# Patient Record
Sex: Female | Born: 1975 | Race: White | Hispanic: No | Marital: Married | Smoking: Never smoker
Health system: Southern US, Community
[De-identification: ages and names within clinical notes are randomized; demographics above are authoritative.]

## PROBLEM LIST (undated history)

## (undated) DIAGNOSIS — I10 Essential (primary) hypertension: Secondary | ICD-10-CM

## (undated) DIAGNOSIS — E119 Type 2 diabetes mellitus without complications: Secondary | ICD-10-CM

---

## 2016-07-16 ENCOUNTER — Other Ambulatory Visit (HOSPITAL_COMMUNITY)
Admission: RE | Admit: 2016-07-16 | Discharge: 2016-07-16 | Disposition: A | Discharge status: Home / Self Care | Payer: Managed Care, Other (non HMO) | Source: Ambulatory Visit | Attending: Obstetrics and Gynecology | Admitting: Obstetrics and Gynecology

## 2016-07-16 ENCOUNTER — Other Ambulatory Visit: Payer: Self-pay | Admitting: Obstetrics and Gynecology

## 2016-07-16 DIAGNOSIS — Z01411 Encounter for gynecological examination (general) (routine) with abnormal findings: Secondary | ICD-10-CM | POA: Diagnosis present

## 2016-07-16 DIAGNOSIS — Z1151 Encounter for screening for human papillomavirus (HPV): Secondary | ICD-10-CM | POA: Diagnosis present

## 2016-07-18 LAB — CYTOLOGY - PAP

## 2016-11-23 ENCOUNTER — Other Ambulatory Visit: Payer: Self-pay | Admitting: Obstetrics & Gynecology

## 2019-12-12 ENCOUNTER — Emergency Department (HOSPITAL_COMMUNITY): Payer: 59

## 2019-12-12 ENCOUNTER — Other Ambulatory Visit: Payer: Self-pay

## 2019-12-12 ENCOUNTER — Emergency Department (HOSPITAL_COMMUNITY)
Admission: EM | Admit: 2019-12-12 | Discharge: 2019-12-12 | Disposition: A | Discharge status: Home / Self Care | Payer: 59 | Attending: Emergency Medicine | Admitting: Emergency Medicine

## 2019-12-12 ENCOUNTER — Encounter (HOSPITAL_COMMUNITY): Payer: Self-pay | Admitting: Emergency Medicine

## 2019-12-12 DIAGNOSIS — R102 Pelvic and perineal pain: Secondary | ICD-10-CM | POA: Diagnosis present

## 2019-12-12 DIAGNOSIS — N390 Urinary tract infection, site not specified: Secondary | ICD-10-CM | POA: Diagnosis not present

## 2019-12-12 DIAGNOSIS — I10 Essential (primary) hypertension: Secondary | ICD-10-CM | POA: Diagnosis not present

## 2019-12-12 DIAGNOSIS — N83202 Unspecified ovarian cyst, left side: Secondary | ICD-10-CM

## 2019-12-12 DIAGNOSIS — E119 Type 2 diabetes mellitus without complications: Secondary | ICD-10-CM | POA: Insufficient documentation

## 2019-12-12 DIAGNOSIS — B373 Candidiasis of vulva and vagina: Secondary | ICD-10-CM | POA: Diagnosis not present

## 2019-12-12 DIAGNOSIS — R52 Pain, unspecified: Secondary | ICD-10-CM

## 2019-12-12 DIAGNOSIS — B3731 Acute candidiasis of vulva and vagina: Secondary | ICD-10-CM

## 2019-12-12 DIAGNOSIS — N809 Endometriosis, unspecified: Secondary | ICD-10-CM | POA: Diagnosis not present

## 2019-12-12 HISTORY — DX: Essential (primary) hypertension: I10

## 2019-12-12 HISTORY — DX: Type 2 diabetes mellitus without complications: E11.9

## 2019-12-12 LAB — URINALYSIS, ROUTINE W REFLEX MICROSCOPIC
Bilirubin Urine: NEGATIVE
Glucose, UA: NEGATIVE mg/dL
Hgb urine dipstick: NEGATIVE
Ketones, ur: NEGATIVE mg/dL
Nitrite: NEGATIVE
Protein, ur: NEGATIVE mg/dL
Specific Gravity, Urine: 1.02 (ref 1.005–1.030)
pH: 6 (ref 5.0–8.0)

## 2019-12-12 LAB — CBC
HCT: 40.6 % (ref 36.0–46.0)
Hemoglobin: 12.4 g/dL (ref 12.0–15.0)
MCH: 26.3 pg (ref 26.0–34.0)
MCHC: 30.5 g/dL (ref 30.0–36.0)
MCV: 86.2 fL (ref 80.0–100.0)
Platelets: 423 10*3/uL — ABNORMAL HIGH (ref 150–400)
RBC: 4.71 MIL/uL (ref 3.87–5.11)
RDW: 13.6 % (ref 11.5–15.5)
WBC: 8.4 10*3/uL (ref 4.0–10.5)
nRBC: 0 % (ref 0.0–0.2)

## 2019-12-12 LAB — WET PREP, GENITAL
Clue Cells Wet Prep HPF POC: NONE SEEN
Sperm: NONE SEEN
Trich, Wet Prep: NONE SEEN

## 2019-12-12 LAB — CBG MONITORING, ED: Glucose-Capillary: 140 mg/dL — ABNORMAL HIGH (ref 70–99)

## 2019-12-12 LAB — COMPREHENSIVE METABOLIC PANEL WITH GFR
ALT: 23 U/L (ref 0–44)
AST: 20 U/L (ref 15–41)
Albumin: 3.9 g/dL (ref 3.5–5.0)
Alkaline Phosphatase: 44 U/L (ref 38–126)
Anion gap: 11 (ref 5–15)
BUN: 11 mg/dL (ref 6–20)
CO2: 23 mmol/L (ref 22–32)
Calcium: 9.5 mg/dL (ref 8.9–10.3)
Chloride: 104 mmol/L (ref 98–111)
Creatinine, Ser: 0.7 mg/dL (ref 0.44–1.00)
GFR calc Af Amer: >60 mL/min
GFR calc non Af Amer: >60 mL/min
Glucose, Bld: 145 mg/dL — ABNORMAL HIGH (ref 70–99)
Potassium: 4.4 mmol/L (ref 3.5–5.1)
Sodium: 138 mmol/L (ref 135–145)
Total Bilirubin: 0.9 mg/dL (ref 0.3–1.2)
Total Protein: 7.2 g/dL (ref 6.5–8.1)

## 2019-12-12 LAB — I-STAT BETA HCG BLOOD, ED (MC, WL, AP ONLY): I-stat hCG, quantitative: <5 m[IU]/mL (ref <5)

## 2019-12-12 LAB — LIPASE, BLOOD: Lipase: 30 U/L (ref 11–51)

## 2019-12-12 MED ORDER — CEPHALEXIN 250 MG PO CAPS
500.0000 mg | ORAL_CAPSULE | Freq: Once | ORAL | Status: AC
  Administered 2019-12-12: 500 mg via ORAL
  Filled 2019-12-12: qty 2

## 2019-12-12 MED ORDER — KETOROLAC TROMETHAMINE 30 MG/ML IJ SOLN
30.0000 mg | Freq: Once | INTRAMUSCULAR | Status: AC
  Administered 2019-12-12: 30 mg via INTRAVENOUS
  Filled 2019-12-12: qty 1

## 2019-12-12 MED ORDER — SODIUM CHLORIDE 0.9% FLUSH: 3.0000 mL | Freq: Once | INTRAVENOUS | Status: DC

## 2019-12-12 MED ORDER — ONDANSETRON HCL 4 MG/2ML IJ SOLN
4.0000 mg | Freq: Once | INTRAMUSCULAR | Status: AC
  Administered 2019-12-12: 4 mg via INTRAVENOUS
  Filled 2019-12-12: qty 2

## 2019-12-12 MED ORDER — HYDROMORPHONE HCL 1 MG/ML IJ SOLN
0.5000 mg | Freq: Once | INTRAMUSCULAR | Status: AC
  Administered 2019-12-12: 0.5 mg via INTRAVENOUS
  Filled 2019-12-12: qty 1

## 2019-12-12 MED ORDER — CEPHALEXIN 500 MG PO CAPS: 1000.0000 mg | ORAL_CAPSULE | Freq: Two times a day (BID) | ORAL | 0 refills | Status: AC

## 2019-12-12 MED ORDER — HYDROMORPHONE HCL 1 MG/ML IJ SOLN
1.0000 mg | Freq: Once | INTRAMUSCULAR | Status: AC
  Administered 2019-12-12: 1 mg via INTRAMUSCULAR
  Filled 2019-12-12: qty 1

## 2019-12-12 MED ORDER — FLUCONAZOLE 200 MG PO TABS
200.0000 mg | ORAL_TABLET | Freq: Once | ORAL | Status: AC
  Administered 2019-12-12: 15:00:00 200 mg via ORAL
  Filled 2019-12-12: qty 1

## 2019-12-12 NOTE — ED Notes (Signed)
Patient verbalizes understanding of discharge instructions . Opportunity for questions and answers were provided . Armband removed by staff ,Pt discharged from ED. W/C  offered at D/C  and Declined W/C at D/C and was escorted to lobby by RN.  

## 2019-12-12 NOTE — ED Notes (Signed)
Patient transported to [facility] 

## 2019-12-12 NOTE — ED Triage Notes (Signed)
C/o L sided pelvic pain that radiates to center of lower back and down L leg with nausea and vomiting since yesterday.  Pain worse over the last hour.  Reports syncopal episode PTA.

## 2019-12-12 NOTE — Discharge Instructions (Addendum)
It was our pleasure to provide your ER care today - we hope that you feel better.  The ultrasound shows a small left ovarian cyst, 1.2 cm. For pelvic pain, endometriosis, as well as the small ovarian cyst, follow up with ob/gyn doctor in the next couple weeks.   Take ibuprofen or naprosyn as need for pain.   For possible urine infection, take antibiotic as prescribed.  Return to ER if worse, new symptoms, high fevers, new/worsening or severe pain, persistent vomiting, or other concern.   You were given pain medication in the ER - no driving for the next 8 hours.

## 2019-12-12 NOTE — ED Provider Notes (Signed)
[facility] EMERGENCY DEPARTMENT Provider Note   CSN: [id number] Arrival date & time: 12/12/19  1038     History Chief Complaint  Patient presents with  . Pelvic Pain  . Back Pain  . Leg Pain    Charlotte Levine is a 44 y.o. female.  Patient c/o left pelvic pain acute onset 3 days ago. Patient notes hx endometriosis and ovarian cysts - indicates significant pain in past related to those issues, but states this pain is sharper. Pain acute onset, moderate-sev, dull to sharp, constant, persistent, felt worse today. No radiation. lnmp 1.5 months ago. No current vaginal discharge or bleeding. No fever or chills. No dysuria or hematuria. No hx kidney stone. Normal appetite. Having normal bms.   The history is provided by the patient.  Pelvic Pain Pertinent negatives include no chest pain, no abdominal pain, no headaches and no shortness of breath.  Back Pain Associated symptoms: leg pain and pelvic pain   Associated symptoms: no abdominal pain, no chest pain, no dysuria, no fever and no headaches   Leg Pain Associated symptoms: back pain   Associated symptoms: no fever and no neck pain        Past Medical History:  Diagnosis Date  . Diabetes mellitus without complication (HCC)   . Hypertension     There are no problems to display for this patient.   History reviewed. No pertinent surgical history.   OB History   No obstetric history on file.     No family history on file.  Social History   Tobacco Use  . Smoking status: Never Smoker  . Smokeless tobacco: Never Used  Substance Use Topics  . Alcohol use: Yes  . Drug use: Not Currently    Home Medications Prior to Admission medications   Not on File    Allergies    Patient has no allergy information on record.  Review of Systems   Review of Systems  Constitutional: Negative for chills and fever.  HENT: Negative for sore throat.   Eyes: Negative for redness.  Respiratory: Negative for  shortness of breath.   Cardiovascular: Negative for chest pain.  Gastrointestinal: Negative for abdominal pain, diarrhea and vomiting.  Genitourinary: Positive for pelvic pain. Negative for dysuria and flank pain.  Musculoskeletal: Positive for back pain. Negative for neck pain.  Skin: Negative for rash.  Neurological: Negative for headaches.  Hematological: Does not bruise/bleed easily.  Psychiatric/Behavioral: Negative for confusion.    Physical Exam Updated Vital Signs BP (!) 175/113 (BP Location: Right Arm)   Pulse (!) 105   Temp 98.6 F (37 C) (Oral)   Resp 20   LMP 10/26/2019   SpO2 100%   Physical Exam Vitals and nursing note reviewed.  Constitutional:      Appearance: Normal appearance. She is well-developed.  HENT:     Head: Atraumatic.     Nose: Nose normal.     Mouth/Throat:     Mouth: Mucous membranes are moist.  Eyes:     General: No scleral icterus.    Conjunctiva/sclera: Conjunctivae normal.  Neck:     Trachea: No tracheal deviation.  Cardiovascular:     Rate and Rhythm: Normal rate.     Pulses: Normal pulses.  Pulmonary:     Effort: Pulmonary effort is normal. No respiratory distress.  Abdominal:     General: Bowel sounds are normal. There is no distension.     Palpations: Abdomen is soft. There is no mass.  Tenderness: There is abdominal tenderness. There is no guarding or rebound.     Hernia: No hernia is present.     Comments: Left pelvic tenderness.   Genitourinary:    Comments: No cva tenderness. Normal external gu exam. Scant vaginal discharge, whitish. No cmt. Cervix closed. No adx mass or focal tenderness.  Musculoskeletal:        General: No swelling.     Cervical back: Normal range of motion and neck supple. No rigidity. No muscular tenderness.     Comments: T/L/S spine non tender, aligned.   Skin:    General: Skin is warm and dry.     Findings: No rash.  Neurological:     Mental Status: She is alert.     Comments: Alert, speech  normal.   Psychiatric:        Mood and Affect: Mood normal.     ED Results / Procedures / Treatments   Labs (all labs ordered are listed, but only abnormal results are displayed) Results for orders placed or performed during the hospital encounter of 12/12/19  Wet prep, genital   Specimen: Vaginal; Genital  Result Value Ref Range   Yeast Wet Prep HPF POC PRESENT (A) NONE SEEN   Trich, Wet Prep NONE SEEN NONE SEEN   Clue Cells Wet Prep HPF POC NONE SEEN NONE SEEN   WBC, Wet Prep HPF POC MANY (A) NONE SEEN   Sperm NONE SEEN   Lipase, blood  Result Value Ref Range   Lipase 30 11 - 51 U/L  Comprehensive metabolic panel  Result Value Ref Range   Sodium 138 135 - 145 mmol/L   Potassium 4.4 3.5 - 5.1 mmol/L   Chloride 104 98 - 111 mmol/L   CO2 23 22 - 32 mmol/L   Glucose, Bld 145 (H) 70 - 99 mg/dL   BUN 11 6 - 20 mg/dL   Creatinine, Ser 4.33 0.44 - 1.00 mg/dL   Calcium 9.5 8.9 - 29.5 mg/dL   Total Protein 7.2 6.5 - 8.1 g/dL   Albumin 3.9 3.5 - 5.0 g/dL   AST 20 15 - 41 U/L   ALT 23 0 - 44 U/L   Alkaline Phosphatase 44 38 - 126 U/L   Total Bilirubin 0.9 0.3 - 1.2 mg/dL   GFR calc non Af Amer >60 >60 mL/min   GFR calc Af Amer >60 >60 mL/min   Anion gap 11 5 - 15  CBC  Result Value Ref Range   WBC 8.4 4.0 - 10.5 K/uL   RBC 4.71 3.87 - 5.11 MIL/uL   Hemoglobin 12.4 12.0 - 15.0 g/dL   HCT 18.8 41.6 - 60.6 %   MCV 86.2 80.0 - 100.0 fL   MCH 26.3 26.0 - 34.0 pg   MCHC 30.5 30.0 - 36.0 g/dL   RDW 30.1 60.1 - 09.3 %   Platelets 423 (H) 150 - 400 K/uL   nRBC 0.0 0.0 - 0.2 %  Urinalysis, Routine w reflex microscopic  Result Value Ref Range   Color, Urine YELLOW YELLOW   APPearance HAZY (A) CLEAR   Specific Gravity, Urine 1.020 1.005 - 1.030   pH 6.0 5.0 - 8.0   Glucose, UA NEGATIVE NEGATIVE mg/dL   Hgb urine dipstick NEGATIVE NEGATIVE   Bilirubin Urine NEGATIVE NEGATIVE   Ketones, ur NEGATIVE NEGATIVE mg/dL   Protein, ur NEGATIVE NEGATIVE mg/dL   Nitrite NEGATIVE NEGATIVE    Leukocytes,Ua TRACE (A) NEGATIVE   RBC / HPF 6-10 0 -  5 RBC/hpf   WBC, UA 11-20 0 - 5 WBC/hpf   Bacteria, UA RARE (A) NONE SEEN   Squamous Epithelial / LPF 0-5 0 - 5   Mucus PRESENT    Non Squamous Epithelial 0-5 (A) NONE SEEN  I-Stat beta hCG blood, ED  Result Value Ref Range   I-stat hCG, quantitative <5.0 <5 mIU/mL   Comment 3          CBG monitoring, ED  Result Value Ref Range   Glucose-Capillary 140 (H) 70 - 99 mg/dL   US PELVIC COMPLETE W TRANSVAGINAL AND TORSION R/O  Result Date: 12/12/2019 CLINICAL DATA:  Left pelvic pain. History of right oophorectomy. History of endometriosis. EXAM: TRANSABDOMINAL AND TRANSVAGINAL ULTRASOUND OF PELVIS DOPPLER ULTRASOUND OF OVARIES TECHNIQUE: Both transabdominal and transvaginal ultrasound examinations of the pelvis were performed. Transabdominal technique was performed for global imaging of the pelvis including uterus, ovaries, adnexal regions, and pelvic cul-de-sac. It was necessary to proceed with endovaginal exam following the transabdominal exam to visualize the uterus, endometrium, and adnexa. Color and duplex Doppler ultrasound was utilized to evaluate blood flow to the ovaries. COMPARISON:  None. FINDINGS: Uterus Measurements: 8.3 by 5.7 by 5.8 cm = volume: 141 mL. No fibroids or other mass visualized. The uterus is anteflexed. Trace fluid in the cervical canal, probably incidental. Heterogeneity of the myometrial echogenicity could reflect adenomyosis. Endometrium Thickness: 0.4 cm.  No focal abnormality visualized. Right ovary Not visualized-patient reports prior right oophorectomy. Left ovary Measurements: 2.8 by 1.9 by 2.1 cm = volume: 5.6 mL. Simple appearing left ovarian cyst or follicle, 1.2 by 1.0 by 1.1 cm. Pulsed Doppler evaluation of both ovaries demonstrates normal low-resistance arterial and venous waveforms. Other findings Accentuated vascularity along the adnexa. IMPRESSION: 1. The left ovary appears normal aside from containing a  1.2 cm small cyst or follicle. The right ovary is surgically absent. 2. Mildly prominent uterus with heterogeneous myometrial echogenicity which could reflect adenomyosis. 3. Accentuated vascularity along the adnexa. Although nonspecific, accentuated parametrial vasculature can be seen in the setting of pelvic congestion syndrome. Electronically Signed   By: Gaylyn Rong M.D.   On: 12/12/2019 13:18    EKG None  Radiology US PELVIC COMPLETE W TRANSVAGINAL AND TORSION R/O  Result Date: 12/12/2019 CLINICAL DATA:  Left pelvic pain. History of right oophorectomy. History of endometriosis. EXAM: TRANSABDOMINAL AND TRANSVAGINAL ULTRASOUND OF PELVIS DOPPLER ULTRASOUND OF OVARIES TECHNIQUE: Both transabdominal and transvaginal ultrasound examinations of the pelvis were performed. Transabdominal technique was performed for global imaging of the pelvis including uterus, ovaries, adnexal regions, and pelvic cul-de-sac. It was necessary to proceed with endovaginal exam following the transabdominal exam to visualize the uterus, endometrium, and adnexa. Color and duplex Doppler ultrasound was utilized to evaluate blood flow to the ovaries. COMPARISON:  None. FINDINGS: Uterus Measurements: 8.3 by 5.7 by 5.8 cm = volume: 141 mL. No fibroids or other mass visualized. The uterus is anteflexed. Trace fluid in the cervical canal, probably incidental. Heterogeneity of the myometrial echogenicity could reflect adenomyosis. Endometrium Thickness: 0.4 cm.  No focal abnormality visualized. Right ovary Not visualized-patient reports prior right oophorectomy. Left ovary Measurements: 2.8 by 1.9 by 2.1 cm = volume: 5.6 mL. Simple appearing left ovarian cyst or follicle, 1.2 by 1.0 by 1.1 cm. Pulsed Doppler evaluation of both ovaries demonstrates normal low-resistance arterial and venous waveforms. Other findings Accentuated vascularity along the adnexa. IMPRESSION: 1. The left ovary appears normal aside from containing a 1.2 cm  small cyst or follicle. The right  ovary is surgically absent. 2. Mildly prominent uterus with heterogeneous myometrial echogenicity which could reflect adenomyosis. 3. Accentuated vascularity along the adnexa. Although nonspecific, accentuated parametrial vasculature can be seen in the setting of pelvic congestion syndrome. Electronically Signed   By: Van Clines M.D.   On: 12/12/2019 13:18    Procedures Procedures (including critical care time)  Medications Ordered in ED Medications  sodium chloride flush (NS) 0.9 % injection 3 mL (has no administration in time range)  HYDROmorphone (DILAUDID) injection 1 mg (has no administration in time range)    ED Course  I have reviewed the triage vital signs and the nursing notes.  Pertinent labs & imaging results that were available during my care of the patient were reviewed by me and considered in my medical decision making (see chart for details).    MDM Rules/Calculators/A&P                      Patient requesting pain medication due to severe pain. Dilaudid im.   Labs sent.  Reviewed nursing notes and prior charts for additional history.   Labs reviewed/interpreted by me - preg negative. ua w 11-20 wbc, ?possible uti.   U/s reviewed/interpreted by me - small ovarian cyst.   Patient indicates pain improved but persists,a nd requests something for nausea. toradol iv, dilaudid .5 mg iv, zofran iv.   Recheck abd soft nt.   Yeast on wet prep. Fluconazole po.   Pt comfortable. No nv. Tolerating po.  Patient currently appears stable for d/c.    Rec pcp/gyn f/u.  Return precautions provided.       Final Clinical Impression(s) / ED Diagnoses Final diagnoses:  None    Rx / DC Orders ED Discharge Orders    None       Lajean Saver, MD 12/12/19 1439

## 2019-12-15 LAB — GC/CHLAMYDIA PROBE AMP (~~LOC~~) NOT AT ARMC
Chlamydia: NEGATIVE
Neisseria Gonorrhea: NEGATIVE

## 2019-12-22 ENCOUNTER — Other Ambulatory Visit: Payer: Self-pay | Admitting: Physician Assistant

## 2019-12-22 ENCOUNTER — Ambulatory Visit
Admission: RE | Admit: 2019-12-22 | Discharge: 2019-12-22 | Disposition: A | Discharge status: Home / Self Care | Payer: 59 | Source: Ambulatory Visit | Attending: Physician Assistant | Admitting: Physician Assistant

## 2019-12-22 DIAGNOSIS — R109 Unspecified abdominal pain: Secondary | ICD-10-CM

## 2020-09-06 IMAGING — CT CT ABD-PELV W/O CM
2 of 4 series · 16 of 46 positions shown, 18 images · non-contrast
Comparison: 12/12/2019 pelvic sonogram.

CLINICAL DATA: Left flank pain radiating to the groin and left
lower quadrant for several days. Recent UTI status post 2 rounds of
antibiotic therapy. Prior right oophorectomy.

EXAM:
CT ABDOMEN AND PELVIS WITHOUT CONTRAST
TECHNIQUE: Multidetector CT imaging of the abdomen and pelvis was performed
following the standard protocol without IV contrast.

[Series 2: renal stone 5.00 br40 s3 axial · axial · 0.97mm/px · z∈[+1192,+1672]mm · 13 of 108 slices shown, 15 images]
[im 8/108  soft-tissue]
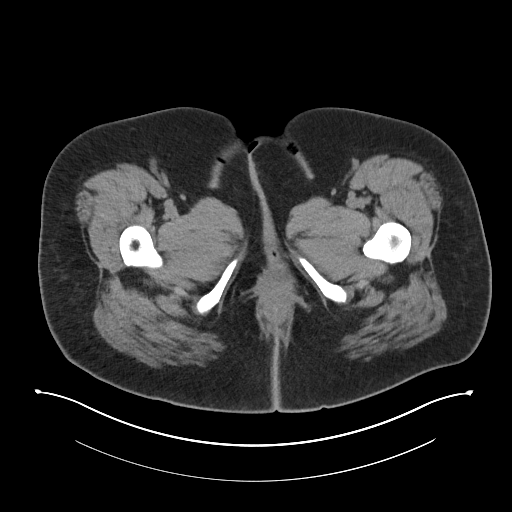
[im 8/108  bone]
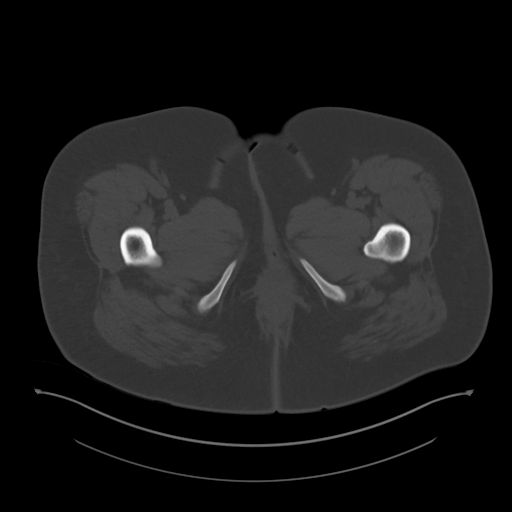
[im 16/108  soft-tissue]
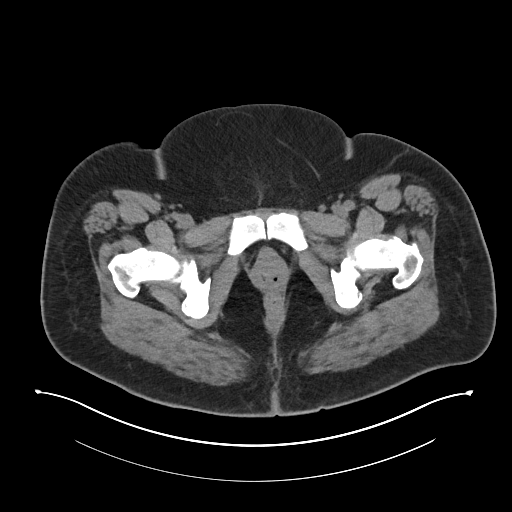
[im 24/108  soft-tissue]
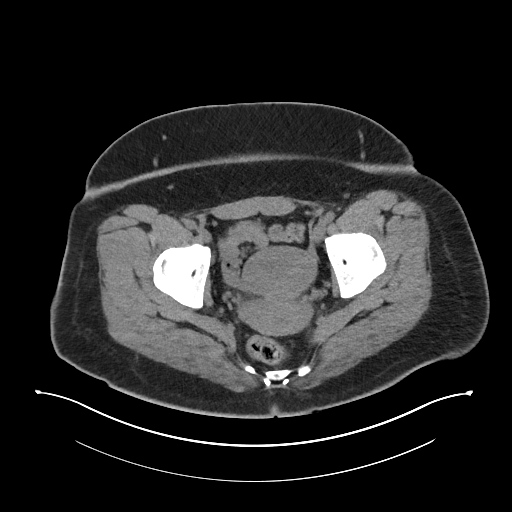
[im 32/108  soft-tissue]
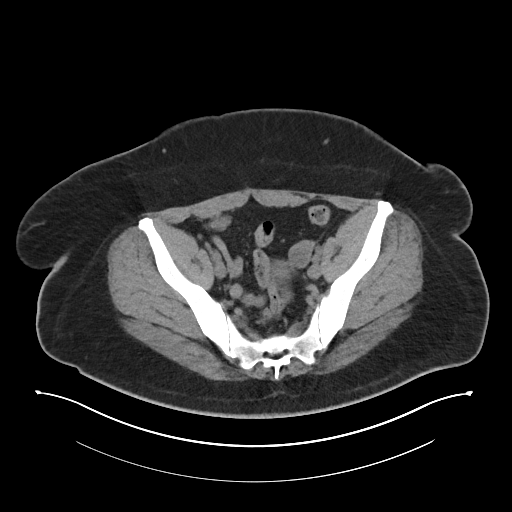
[im 40/108  soft-tissue]
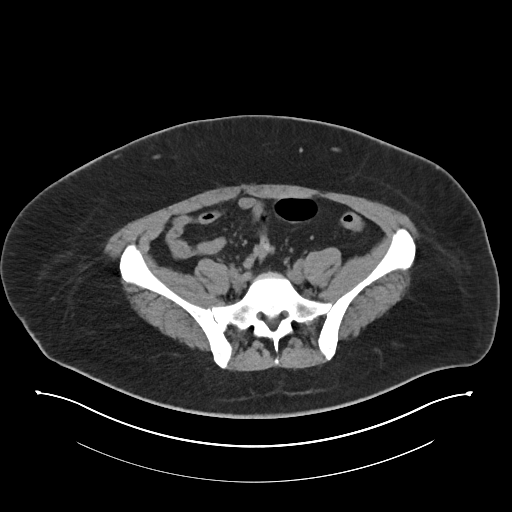
[im 48/108  soft-tissue]
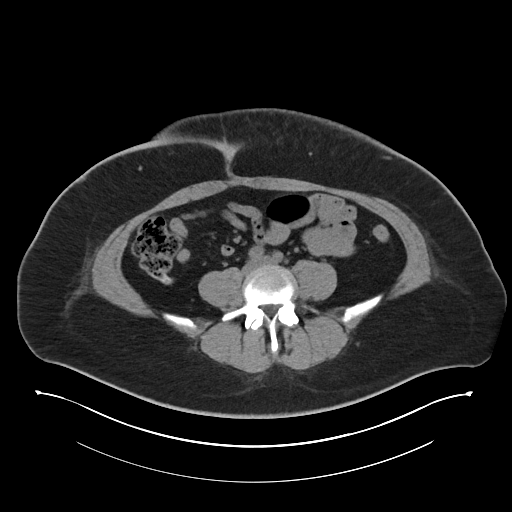
[im 56/108  soft-tissue]
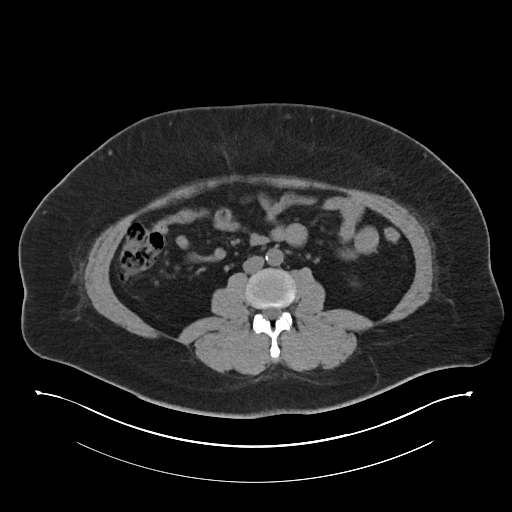
[im 64/108  soft-tissue]
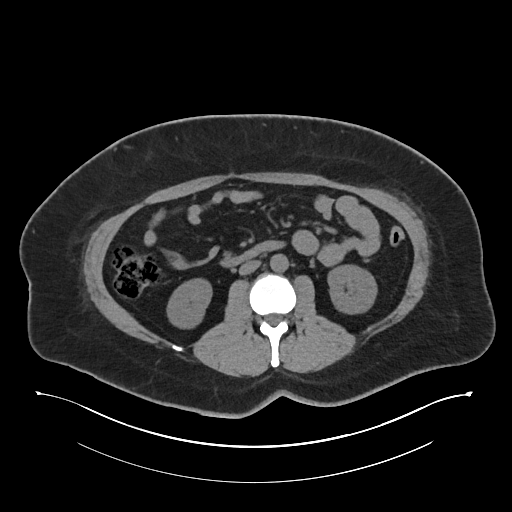
[im 72/108  soft-tissue]
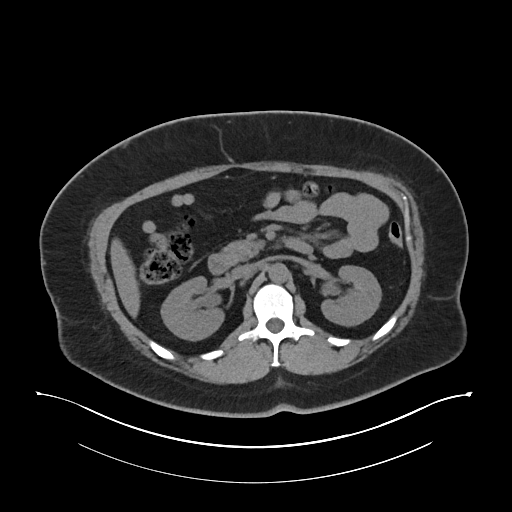
[im 72/108  bone]
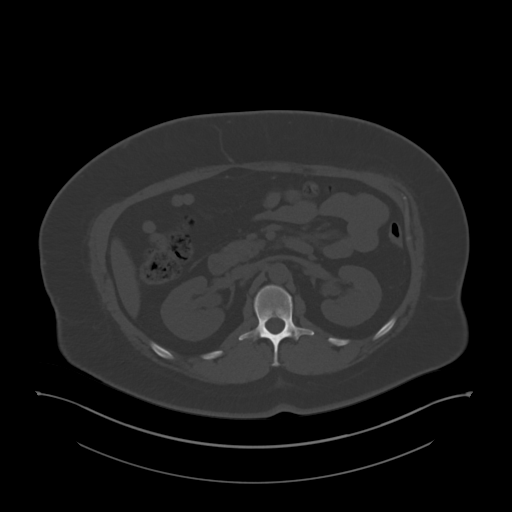
[im 80/108  soft-tissue]
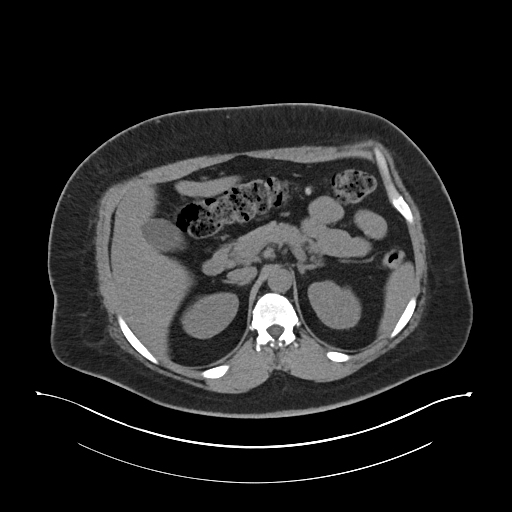
[im 88/108  soft-tissue]
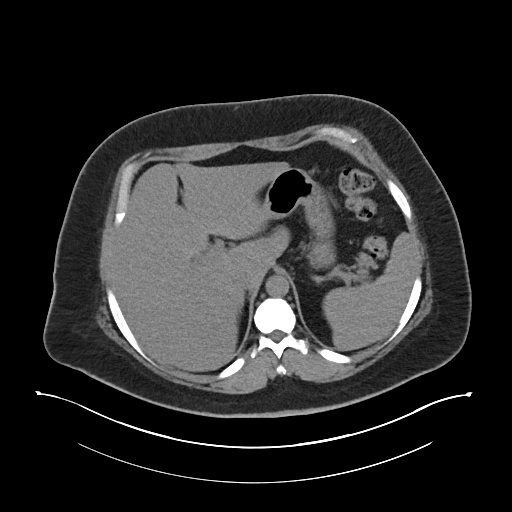
[im 96/108  soft-tissue]
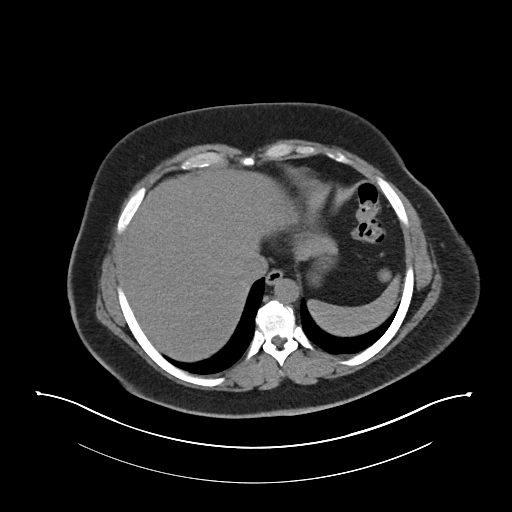
[im 104/108  soft-tissue]
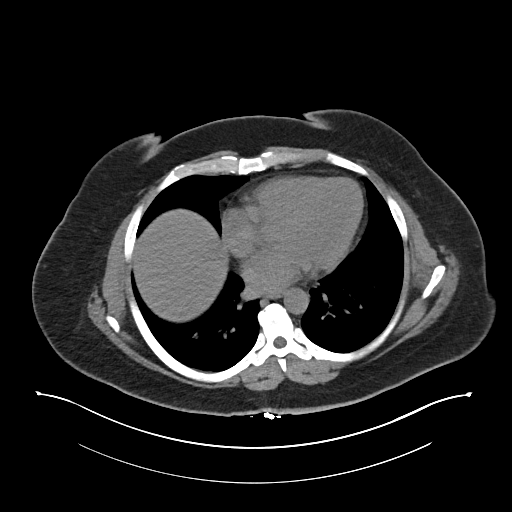

[Series 6: renal stone 2.00 br40 s3 cor · coronal · 0.97mm/px · 3 of 190 slices shown]
[im 64/190  soft-tissue]
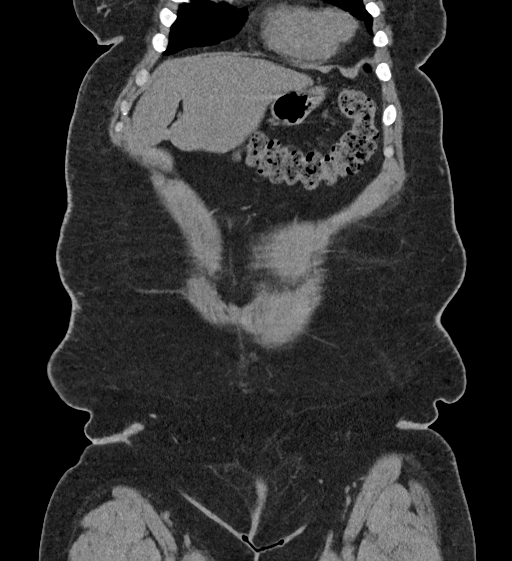
[im 85/190  soft-tissue]
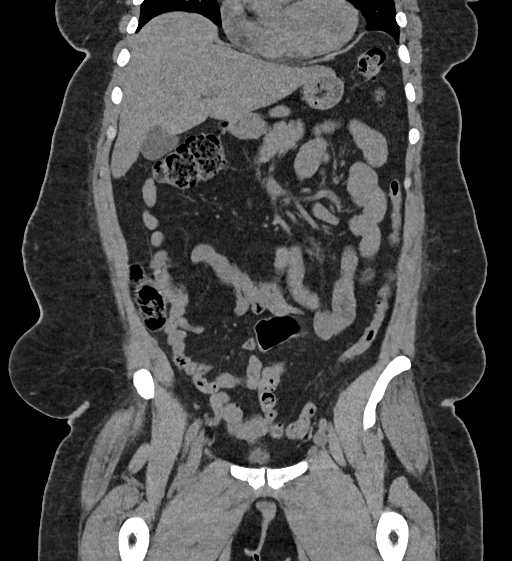
[im 106/190  soft-tissue]
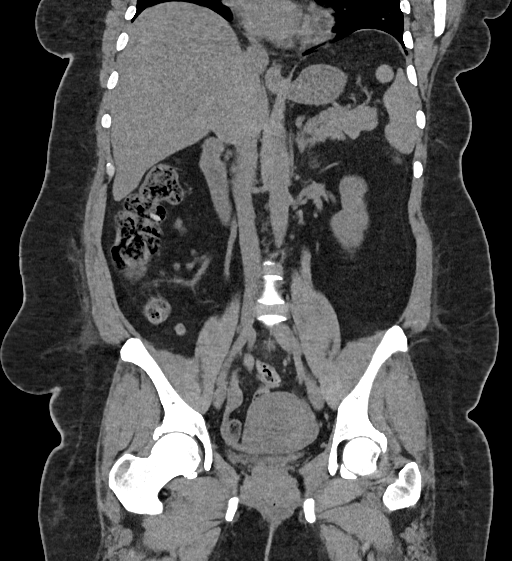

[16 of 46 positions shown; findings below may reference images not displayed]

FINDINGS: Lower chest: No significant pulmonary nodules or acute consolidative
airspace disease.

Hepatobiliary: Normal liver size. No liver mass. Normal gallbladder
with no radiopaque cholelithiasis. No biliary ductal dilatation.

Pancreas: Normal, with no mass or duct dilation.

Spleen: Normal size. No mass.

Adrenals/Urinary Tract: Normal adrenals. Symmetric fullness of the
central renal collecting systems bilaterally with abrupt caliber
transitions at the ureteropelvic junctions bilaterally. No renal
stones. No contour deforming renal masses. Normal caliber ureters.
No ureteral stones. Normal nondistended bladder.

Stomach/Bowel: Normal non-distended stomach. Normal caliber small
bowel with no small bowel wall thickening. Normal appendix. Minimal
sigmoid diverticulosis, with no large bowel wall thickening or
significant pericolonic fat stranding.

Vascular/Lymphatic: Mildly atherosclerotic nonaneurysmal abdominal
aorta. No pathologically enlarged lymph nodes in the abdomen or
pelvis.

Reproductive: Mildly enlarged anteverted uterus. Right ovary may.
Normal left ovary. No adnexal masses.

Other: No pneumoperitoneum, ascites or focal fluid collection.

Musculoskeletal: No aggressive appearing focal osseous lesions. Mild
degenerative disc disease at L5-S1.
IMPRESSION: 1. No urolithiasis.
2. Symmetric fullness of the central renal collecting systems
bilaterally with somewhat abrupt caliber transitions at the UPJs,
suggestive of chronic/congenital low-grade bilateral UPJ
obstruction. No surrounding fat stranding or fluid to suggest an
acute urinary tract obstruction.
3. Minimal sigmoid diverticulosis, with no evidence of acute
diverticulitis. No evidence of bowel obstruction or acute bowel
inflammation. Normal appendix.
4. Mildly enlarged anteverted uterus, nonspecific. Correlate with
recent pelvic ultrasound.
5. Aortic Atherosclerosis (E06OC-UNG.G).

These results will be called to the ordering clinician or
representative by the [HOSPITAL] at the imaging location.

## 2021-11-28 DIAGNOSIS — M4326 Fusion of spine, lumbar region: Secondary | ICD-10-CM | POA: Diagnosis not present

## 2021-12-05 DIAGNOSIS — M4326 Fusion of spine, lumbar region: Secondary | ICD-10-CM | POA: Diagnosis not present

## 2021-12-05 DIAGNOSIS — Z7409 Other reduced mobility: Secondary | ICD-10-CM | POA: Diagnosis not present

## 2021-12-05 DIAGNOSIS — R262 Difficulty in walking, not elsewhere classified: Secondary | ICD-10-CM | POA: Diagnosis not present

## 2021-12-07 DIAGNOSIS — Z7409 Other reduced mobility: Secondary | ICD-10-CM | POA: Diagnosis not present

## 2021-12-07 DIAGNOSIS — M4326 Fusion of spine, lumbar region: Secondary | ICD-10-CM | POA: Diagnosis not present

## 2021-12-07 DIAGNOSIS — R262 Difficulty in walking, not elsewhere classified: Secondary | ICD-10-CM | POA: Diagnosis not present

## 2021-12-13 DIAGNOSIS — R262 Difficulty in walking, not elsewhere classified: Secondary | ICD-10-CM | POA: Diagnosis not present

## 2021-12-13 DIAGNOSIS — M4326 Fusion of spine, lumbar region: Secondary | ICD-10-CM | POA: Diagnosis not present

## 2021-12-13 DIAGNOSIS — Z7409 Other reduced mobility: Secondary | ICD-10-CM | POA: Diagnosis not present

## 2021-12-20 DIAGNOSIS — R262 Difficulty in walking, not elsewhere classified: Secondary | ICD-10-CM | POA: Diagnosis not present

## 2021-12-20 DIAGNOSIS — Z7409 Other reduced mobility: Secondary | ICD-10-CM | POA: Diagnosis not present

## 2021-12-20 DIAGNOSIS — M4326 Fusion of spine, lumbar region: Secondary | ICD-10-CM | POA: Diagnosis not present

## 2022-02-20 DIAGNOSIS — M4326 Fusion of spine, lumbar region: Secondary | ICD-10-CM | POA: Diagnosis not present

## 2022-02-20 DIAGNOSIS — M48062 Spinal stenosis, lumbar region with neurogenic claudication: Secondary | ICD-10-CM | POA: Diagnosis not present

## 2022-03-19 DIAGNOSIS — E1169 Type 2 diabetes mellitus with other specified complication: Secondary | ICD-10-CM | POA: Diagnosis not present

## 2022-03-19 DIAGNOSIS — I700 Atherosclerosis of aorta: Secondary | ICD-10-CM | POA: Diagnosis not present

## 2022-03-19 DIAGNOSIS — F339 Major depressive disorder, recurrent, unspecified: Secondary | ICD-10-CM | POA: Diagnosis not present

## 2022-03-19 DIAGNOSIS — E7800 Pure hypercholesterolemia, unspecified: Secondary | ICD-10-CM | POA: Diagnosis not present

## 2022-03-22 DIAGNOSIS — M1612 Unilateral primary osteoarthritis, left hip: Secondary | ICD-10-CM | POA: Diagnosis not present

## 2022-03-22 DIAGNOSIS — M4326 Fusion of spine, lumbar region: Secondary | ICD-10-CM | POA: Diagnosis not present

## 2022-03-22 DIAGNOSIS — M549 Dorsalgia, unspecified: Secondary | ICD-10-CM | POA: Diagnosis not present

## 2022-07-24 DIAGNOSIS — M4326 Fusion of spine, lumbar region: Secondary | ICD-10-CM | POA: Diagnosis not present

## 2022-07-24 DIAGNOSIS — M1612 Unilateral primary osteoarthritis, left hip: Secondary | ICD-10-CM | POA: Diagnosis not present

## 2022-10-09 DIAGNOSIS — E1169 Type 2 diabetes mellitus with other specified complication: Secondary | ICD-10-CM | POA: Diagnosis not present

## 2022-10-09 DIAGNOSIS — Z0000 Encounter for general adult medical examination without abnormal findings: Secondary | ICD-10-CM | POA: Diagnosis not present

## 2022-10-09 DIAGNOSIS — E7800 Pure hypercholesterolemia, unspecified: Secondary | ICD-10-CM | POA: Diagnosis not present

## 2023-01-29 DIAGNOSIS — R921 Mammographic calcification found on diagnostic imaging of breast: Secondary | ICD-10-CM | POA: Diagnosis not present

## 2023-01-29 DIAGNOSIS — R92323 Mammographic fibroglandular density, bilateral breasts: Secondary | ICD-10-CM | POA: Diagnosis not present

## 2023-04-10 DIAGNOSIS — F419 Anxiety disorder, unspecified: Secondary | ICD-10-CM | POA: Diagnosis not present

## 2023-04-10 DIAGNOSIS — E1169 Type 2 diabetes mellitus with other specified complication: Secondary | ICD-10-CM | POA: Diagnosis not present

## 2023-04-10 DIAGNOSIS — Z6832 Body mass index (BMI) 32.0-32.9, adult: Secondary | ICD-10-CM | POA: Diagnosis not present

## 2023-04-10 DIAGNOSIS — Z5181 Encounter for therapeutic drug level monitoring: Secondary | ICD-10-CM | POA: Diagnosis not present

## 2023-04-10 DIAGNOSIS — F339 Major depressive disorder, recurrent, unspecified: Secondary | ICD-10-CM | POA: Diagnosis not present

## 2023-04-10 DIAGNOSIS — E7800 Pure hypercholesterolemia, unspecified: Secondary | ICD-10-CM | POA: Diagnosis not present

## 2023-06-20 DIAGNOSIS — M963 Postlaminectomy kyphosis: Secondary | ICD-10-CM | POA: Diagnosis not present

## 2023-06-20 DIAGNOSIS — M5432 Sciatica, left side: Secondary | ICD-10-CM | POA: Diagnosis not present

## 2023-08-08 DIAGNOSIS — Z6832 Body mass index (BMI) 32.0-32.9, adult: Secondary | ICD-10-CM | POA: Diagnosis not present

## 2023-08-08 DIAGNOSIS — E7800 Pure hypercholesterolemia, unspecified: Secondary | ICD-10-CM | POA: Diagnosis not present

## 2023-12-19 DIAGNOSIS — M5432 Sciatica, left side: Secondary | ICD-10-CM | POA: Diagnosis not present

## 2023-12-19 DIAGNOSIS — M963 Postlaminectomy kyphosis: Secondary | ICD-10-CM | POA: Diagnosis not present

## 2024-03-17 DIAGNOSIS — F419 Anxiety disorder, unspecified: Secondary | ICD-10-CM | POA: Diagnosis not present

## 2024-03-17 DIAGNOSIS — F339 Major depressive disorder, recurrent, unspecified: Secondary | ICD-10-CM | POA: Diagnosis not present

## 2024-03-17 DIAGNOSIS — Z124 Encounter for screening for malignant neoplasm of cervix: Secondary | ICD-10-CM | POA: Diagnosis not present

## 2024-03-17 DIAGNOSIS — J302 Other seasonal allergic rhinitis: Secondary | ICD-10-CM | POA: Diagnosis not present

## 2024-03-17 DIAGNOSIS — E7800 Pure hypercholesterolemia, unspecified: Secondary | ICD-10-CM | POA: Diagnosis not present

## 2024-03-17 DIAGNOSIS — E661 Drug-induced obesity: Secondary | ICD-10-CM | POA: Diagnosis not present

## 2024-03-17 DIAGNOSIS — Z0000 Encounter for general adult medical examination without abnormal findings: Secondary | ICD-10-CM | POA: Diagnosis not present

## 2024-03-17 DIAGNOSIS — E1169 Type 2 diabetes mellitus with other specified complication: Secondary | ICD-10-CM | POA: Diagnosis not present

## 2024-06-17 DIAGNOSIS — F419 Anxiety disorder, unspecified: Secondary | ICD-10-CM | POA: Diagnosis not present

## 2024-06-17 DIAGNOSIS — Z6832 Body mass index (BMI) 32.0-32.9, adult: Secondary | ICD-10-CM | POA: Diagnosis not present

## 2024-06-17 DIAGNOSIS — R21 Rash and other nonspecific skin eruption: Secondary | ICD-10-CM | POA: Diagnosis not present

## 2024-06-17 DIAGNOSIS — E1169 Type 2 diabetes mellitus with other specified complication: Secondary | ICD-10-CM | POA: Diagnosis not present

## 2024-06-24 DIAGNOSIS — L920 Granuloma annulare: Secondary | ICD-10-CM | POA: Diagnosis not present

## 2024-06-24 DIAGNOSIS — L501 Idiopathic urticaria: Secondary | ICD-10-CM | POA: Diagnosis not present

## 2024-06-24 DIAGNOSIS — L309 Dermatitis, unspecified: Secondary | ICD-10-CM | POA: Diagnosis not present

## 2024-07-23 DIAGNOSIS — J988 Other specified respiratory disorders: Secondary | ICD-10-CM | POA: Diagnosis not present

## 2024-07-23 DIAGNOSIS — J302 Other seasonal allergic rhinitis: Secondary | ICD-10-CM | POA: Diagnosis not present

## 2024-07-23 DIAGNOSIS — E1169 Type 2 diabetes mellitus with other specified complication: Secondary | ICD-10-CM | POA: Diagnosis not present

## 2024-08-31 ENCOUNTER — Encounter (INDEPENDENT_AMBULATORY_CARE_PROVIDER_SITE_OTHER): Payer: Self-pay | Admitting: Physician Assistant

## 2024-08-31 ENCOUNTER — Ambulatory Visit (INDEPENDENT_AMBULATORY_CARE_PROVIDER_SITE_OTHER): Admitting: Physician Assistant

## 2024-08-31 VITALS — BP 123/79 | HR 79

## 2024-08-31 DIAGNOSIS — H9392 Unspecified disorder of left ear: Secondary | ICD-10-CM

## 2024-08-31 DIAGNOSIS — H6993 Unspecified Eustachian tube disorder, bilateral: Secondary | ICD-10-CM

## 2024-08-31 DIAGNOSIS — J309 Allergic rhinitis, unspecified: Secondary | ICD-10-CM

## 2024-08-31 NOTE — Progress Notes (Signed)
 Dear Dr. Alvera, Here is my assessment for our mutual patient, Charlotte Levine. Thank you for allowing me the opportunity to care for your patient. Please do not hesitate to contact me should you have any other questions. Sincerely, Chyrl Cohen PA-C  Otolaryngology Clinic Note Referring provider: Dr. Alvera HPI:  Charlotte Levine is a 48 y.o. female kindly referred by Dr. Alvera   Discussed the use of AI scribe software for clinical note transcription with the patient, who gave verbal consent to proceed.  History of Present Illness   Charlotte Levine is a 48 year old female who presents with persistent left ear pressure and muffled hearing.  She has been experiencing consistent ear issues over the past couple of months, primarily involving swelling and pressure in the left ear. The sensation is described as 'pressure almost like underwater' and leads to headaches. She has never had ear problems before but did undergo sinus surgery in 2010 for recurrent sinus infections, which resolved her sinus issues.  Initially, she consulted her primary care physician, who noted inflammation but no infection, and prescribed a round of prednisone, which temporarily alleviated the symptoms. However, the symptoms have since returned. She also tried Augmentin, which was ineffective, and has been using Claritin daily for her regular allergies. Flonase was used for three weeks without significant improvement.  The pressure is located behind the ear and extends upwards, with a sensation of fullness and muffled hearing. Clicking and popping in the ears occur, especially when swallowing or blowing her nose, but there is no significant pain. Ear popping techniques provide minimal relief, and she reports a staticky sound when attempting to clear her ears.  Her past medical history includes functional endoscopic sinus surgery in 2010 to address a deviated septum and sinus scarring, which successfully resolved her sinus  infections. She has no history of ear infections as a child and had her tonsils and adenoids removed at age 54 due to recurrent tonsillitis.  Family history is notable for her mother and grandfather experiencing early hearing loss. No sinus congestion or significant pain. Regular allergies are managed with Claritin.           Independent Review of Additional Tests or Records:  none   PMH/Meds/All/SocHx/FamHx/ROS:   Past Medical History:  Diagnosis Date   Diabetes mellitus without complication (HCC)    Hypertension      History reviewed. No pertinent surgical history.  History reviewed. No pertinent family history.   Social Connections: Not on file      Current Outpatient Medications:    buPROPion (WELLBUTRIN XL) 300 MG 24 hr tablet, Take 300 mg by mouth every morning., Disp: , Rfl:    busPIRone (BUSPAR) 10 MG tablet, Take 10 mg by mouth 2 (two) times daily., Disp: , Rfl:    ibuprofen (ADVIL) 800 MG tablet, Take 800 mg by mouth 3 (three) times daily as needed., Disp: , Rfl:    loratadine (CLARITIN) 10 MG tablet, Take 10 mg by mouth daily., Disp: , Rfl:    losartan (COZAAR) 50 MG tablet, Take 50 mg by mouth daily., Disp: , Rfl:    metFORMIN (GLUCOPHAGE) 500 MG tablet, Take 500 mg by mouth 2 (two) times daily., Disp: , Rfl:    MOUNJARO 12.5 MG/0.5ML Pen, Inject 12.5 mg into the skin once a week., Disp: , Rfl:    Multiple Vitamin (MULTI-VITAMIN) tablet, Take 1 tablet by mouth daily., Disp: , Rfl:    rosuvastatin (CRESTOR) 5 MG tablet, Take 5 mg by mouth  daily., Disp: , Rfl:    triamcinolone cream (KENALOG) 0.1 %, Apply 1 Application topically 2 (two) times daily as needed (hives)., Disp: , Rfl:    cephALEXin  (KEFLEX ) 500 MG capsule, Take 2 capsules (1,000 mg total) by mouth 2 (two) times daily. (Patient not taking: Reported on 08/31/2024), Disp: 20 capsule, Rfl: 0   escitalopram (LEXAPRO) 10 MG tablet, Take 10 mg by mouth daily. (Patient not taking: Reported on 08/31/2024), Disp: ,  Rfl:    Alizia 0.35 MG tablet, Take 1 tablet by mouth daily. (Patient not taking: Reported on 08/31/2024), Disp: , Rfl:    Physical Exam:   BP 123/79   Pulse 79   SpO2 96%   Pertinent Findings  CN II-XII grossly intact Bilateral EAC clear and TM intact with well pneumatized middle ear spaces, left TM moves with valsalva  Weber 512: equal Rinne 512: AC > BC b/l  Anterior rhinoscopy: Septum midline; bilateral inferior turbinates with no hypertrophy No lesions of oral cavity/oropharynx; dentition WNL, absent tonsils  No obviously palpable neck masses/lymphadenopathy/thyromegaly No respiratory distress or stridor        Seprately Identifiable Procedures:  None  Impression & Plans:  [name] is a 48 y.o. female with the following   Assessment and Plan    Eustachian tube dysfunction Chronic left ear pressure and muffled hearing for two months, consistent with Eustachian tube dysfunction. Likely related to allergies or functional limitations of the Eustachian tube. Previous prednisone treatment provided temporary relief. Differential includes functional limitations of the Eustachian tube and allergy-related inflammation. - Ordered hearing evaluation to assess eardrum movement, tube function, and hearing status. - Continue daily Claritin for allergy management. - Perform daily sinus irrigations. - Use Flonase daily for three months to reduce inflammation.   Allergic rhinitis Chronic allergic rhinitis managed with daily Claritin and Flonase. Allergies may contribute to Eustachian tube dysfunction. - Continue daily Claritin. - Use Flonase daily for three months.           - f/u 3 months, phone call with audio results if abnormal    Thank you for allowing me the opportunity to care for your patient. Please do not hesitate to contact me should you have any other questions.  Sincerely, Chyrl Cohen PA-C [facility] Phone: [phone] Fax:  [phone]  08/31/2024, 2:36 PM

## 2024-09-01 DIAGNOSIS — L920 Granuloma annulare: Secondary | ICD-10-CM | POA: Diagnosis not present

## 2024-09-01 DIAGNOSIS — L821 Other seborrheic keratosis: Secondary | ICD-10-CM | POA: Diagnosis not present

## 2024-09-29 DIAGNOSIS — M4722 Other spondylosis with radiculopathy, cervical region: Secondary | ICD-10-CM | POA: Diagnosis not present

## 2024-09-29 DIAGNOSIS — Z1331 Encounter for screening for depression: Secondary | ICD-10-CM | POA: Diagnosis not present

## 2024-09-29 DIAGNOSIS — M50122 Cervical disc disorder at C5-C6 level with radiculopathy: Secondary | ICD-10-CM | POA: Diagnosis not present

## 2024-09-29 DIAGNOSIS — M542 Cervicalgia: Secondary | ICD-10-CM | POA: Diagnosis not present

## 2024-10-02 DIAGNOSIS — M47812 Spondylosis without myelopathy or radiculopathy, cervical region: Secondary | ICD-10-CM | POA: Diagnosis not present

## 2024-10-02 DIAGNOSIS — M50122 Cervical disc disorder at C5-C6 level with radiculopathy: Secondary | ICD-10-CM | POA: Diagnosis not present

## 2024-10-02 DIAGNOSIS — M542 Cervicalgia: Secondary | ICD-10-CM | POA: Diagnosis not present

## 2024-10-02 DIAGNOSIS — M4722 Other spondylosis with radiculopathy, cervical region: Secondary | ICD-10-CM | POA: Diagnosis not present

## 2024-10-12 ENCOUNTER — Ambulatory Visit (INDEPENDENT_AMBULATORY_CARE_PROVIDER_SITE_OTHER): Admitting: Audiology

## 2024-10-12 ENCOUNTER — Encounter (INDEPENDENT_AMBULATORY_CARE_PROVIDER_SITE_OTHER): Payer: Self-pay

## 2024-10-15 DIAGNOSIS — M542 Cervicalgia: Secondary | ICD-10-CM | POA: Diagnosis not present

## 2024-10-15 DIAGNOSIS — M50122 Cervical disc disorder at C5-C6 level with radiculopathy: Secondary | ICD-10-CM | POA: Diagnosis not present

## 2024-10-15 DIAGNOSIS — M4722 Other spondylosis with radiculopathy, cervical region: Secondary | ICD-10-CM | POA: Diagnosis not present

## 2024-10-26 ENCOUNTER — Telehealth (INDEPENDENT_AMBULATORY_CARE_PROVIDER_SITE_OTHER): Payer: Self-pay | Admitting: Physician Assistant

## 2024-10-26 NOTE — Telephone Encounter (Signed)
 Left voicemail to reschedule appointment with Chyrl on February 2,2026, he will be out of the office

## 2024-11-04 ENCOUNTER — Encounter (INDEPENDENT_AMBULATORY_CARE_PROVIDER_SITE_OTHER): Payer: Self-pay

## 2024-11-04 ENCOUNTER — Telehealth (INDEPENDENT_AMBULATORY_CARE_PROVIDER_SITE_OTHER): Payer: Self-pay | Admitting: Physician Assistant

## 2024-11-04 NOTE — Telephone Encounter (Signed)
 Left voice message and MyChart message for patient to call back to reschedule 11/30/2024 appointment.  Chyrl will not be in the office.

## 2024-11-30 ENCOUNTER — Ambulatory Visit (INDEPENDENT_AMBULATORY_CARE_PROVIDER_SITE_OTHER): Admitting: Physician Assistant
